# Patient Record
Sex: Male | Born: 1980 | Race: White | Hispanic: No | Marital: Single | Smoking: Current every day smoker
Health system: Southern US, Community
[De-identification: ages and names within clinical notes are randomized; demographics above are authoritative.]

---

## 2022-01-08 ENCOUNTER — Encounter (HOSPITAL_COMMUNITY): Payer: Self-pay | Admitting: Emergency Medicine

## 2022-01-08 ENCOUNTER — Emergency Department (HOSPITAL_COMMUNITY): Payer: No Typology Code available for payment source

## 2022-01-08 ENCOUNTER — Emergency Department (HOSPITAL_COMMUNITY)
Admission: EM | Admit: 2022-01-08 | Discharge: 2022-01-08 | Disposition: A | Discharge status: Home / Self Care | Payer: No Typology Code available for payment source | Attending: Emergency Medicine | Admitting: Emergency Medicine

## 2022-01-08 DIAGNOSIS — Z23 Encounter for immunization: Secondary | ICD-10-CM | POA: Insufficient documentation

## 2022-01-08 DIAGNOSIS — S61215A Laceration without foreign body of left ring finger without damage to nail, initial encounter: Secondary | ICD-10-CM | POA: Diagnosis not present

## 2022-01-08 DIAGNOSIS — W208XXA Other cause of strike by thrown, projected or falling object, initial encounter: Secondary | ICD-10-CM | POA: Insufficient documentation

## 2022-01-08 DIAGNOSIS — Y990 Civilian activity done for income or pay: Secondary | ICD-10-CM | POA: Insufficient documentation

## 2022-01-08 DIAGNOSIS — S6992XA Unspecified injury of left wrist, hand and finger(s), initial encounter: Secondary | ICD-10-CM | POA: Diagnosis present

## 2022-01-08 DIAGNOSIS — S66802A Unspecified injury of other specified muscles, fascia and tendons at wrist and hand level, left hand, initial encounter: Secondary | ICD-10-CM | POA: Insufficient documentation

## 2022-01-08 MED ORDER — TETANUS-DIPHTH-ACELL PERTUSSIS 5-2.5-18.5 LF-MCG/0.5 IM SUSY
0.5000 mL | PREFILLED_SYRINGE | Freq: Once | INTRAMUSCULAR | Status: AC
  Administered 2022-01-08: 0.5 mL via INTRAMUSCULAR
  Filled 2022-01-08: qty 0.5

## 2022-01-08 MED ORDER — LIDOCAINE HCL (PF) 1 % IJ SOLN
5.0000 mL | Freq: Once | INTRAMUSCULAR | Status: AC
  Administered 2022-01-08: 5 mL
  Filled 2022-01-08: qty 5

## 2022-01-08 NOTE — Discharge Instructions (Addendum)
Wear the splint at all times except when you are showering.  It is okay for the wound to get wet but do not scrub directly on it or soak it under water.  Change the bandage tomorrow.  It is fine for you to return to work but you will need to do light duty where you are not going to reinjure your finger.  Follow-up with a hand surgeon this week.  You can use Tylenol or ibuprofen as needed for pain. ?

## 2022-01-08 NOTE — ED Triage Notes (Signed)
Pt. Stated a rack on a car fell on my finger , (left ring finger)  ?Avulsion on the tip of left ring finger ?

## 2022-01-08 NOTE — ED Provider Notes (Signed)
?[facility] ?Provider Note ? ? ?CSN: [id number] ?Arrival date & time: 01/08/22  [id number] ? ?  ? ?History ? ?Chief Complaint  ?Patient presents with  ? Finger Injury  ? ? ?Cameron Fuller is a 41 y.o. male. ? ?Patient is a 41 year old healthy male presenting today with an injury to his left ring finger.  Patient was pushing a cart at work where he works at [employer].  The cart slipped and his finger got smashed but he is not sure if it got smashed on the rack with the bricks or another way.  He has a laceration to the tip of his finger his nail is still intact and he is having pain to the end of the finger.  He is having difficulty bending the finger.  Denies any injury elsewhere.  Unclear when his last tetanus shot was but he says its been years ? ?The history is provided by the patient.  ? ?  ? ?Home Medications ?Prior to Admission medications   ?Not on File  ?   ? ?Allergies    ?Patient has no allergy information on record.   ? ?Review of Systems   ?Review of Systems ? ?Physical Exam ?Updated Vital Signs ?BP 109/78 (BP Location: Right Arm)   Pulse 64   Temp 98.7 ?F (37.1 ?C) (Oral)   Resp 17   SpO2 96%  ?Physical Exam ?Vitals reviewed.  ?Constitutional:   ?   Appearance: Normal appearance.  ?HENT:  ?   Head: Normocephalic.  ?Pulmonary:  ?   Effort: Pulmonary effort is normal.  ?Musculoskeletal:     ?   General: Tenderness and signs of injury present.  ?     Hands: ? ?Neurological:  ?   Mental Status: He is alert. Mental status is at baseline.  ?Psychiatric:     ?   Mood and Affect: Mood normal.  ? ? ?ED Results / Procedures / Treatments   ?Labs ?(all labs ordered are listed, but only abnormal results are displayed) ?Labs Reviewed - No data to display ? ?EKG ?None ? ?Radiology ?DG Finger Ring Left ? ?Result Date: 01/08/2022 ?CLINICAL DATA:  Left ring finger injury. Finger was smashed with a cube of bricks. EXAM: LEFT RING FINGER 2+V COMPARISON:  None FINDINGS: Bony detail  somewhat obscured by overlying bandage. No definite acute fracture is identified. The joint spaces are maintained. No obvious radiopaque foreign body. IMPRESSION: No definite acute fracture or radiopaque foreign body. Electronically Signed   By: Marijo Sanes M.D.   On: 01/08/2022 10:56   ? ?Procedures ?Procedures  ? ? ?LACERATION REPAIR ?Performed by: Paiten Boies ?Authorized by: Aodhan Scheidt ?Consent: Verbal consent obtained. ?Risks and benefits: risks, benefits and alternatives were discussed ?Consent given by: patient ?Patient identity confirmed: provided demographic data ?Prepped and Draped in normal sterile fashion ?Wound explored ? ?Laceration Location: left ring finger ? ?Laceration Length: 1cm ? ?No Foreign Bodies seen or palpated ? ?Anesthesia:digital block ? ?Local anesthetic: lidocaine 1% without epinephrine ? ?Anesthetic total: 2 ml ? ?Irrigation method: syringe ?Amount of cleaning: standard ? ?Skin closure: 5.0 vicryl rapide ? ?Number of sutures: 3 ? ?Technique: simple interrupted ? ?Patient tolerance: Patient tolerated the procedure well with no immediate complications. ? ?Medications Ordered in ED ?Medications  ?lidocaine (PF) (XYLOCAINE) 1 % injection 5 mL (5 mLs Other Given by Other 01/08/22 1021)  ?Tdap (BOOSTRIX) injection 0.5 mL (0.5 mLs Intramuscular Given 01/08/22 1019)  ? ? ?ED Course/ Medical Decision  Making/ A&P ?  ?                        ?Medical Decision Making ?Amount and/or Complexity of Data Reviewed ?Radiology: ordered and independent interpretation performed. Decision-making details documented in ED Course. ? ?Risk ?Prescription drug management. ? ? ?Patient presenting today after an injury to his finger.  Patient does have some nail involvement and laceration/avulsion present.  Patient is having difficulty flexing at the DIP joint and concern for possible tendon injury.  Plain films are pending.  Area was anesthetized with a digital block.  Tetanus shot was updated. ? ?11:41  AM ?I independently interpreted patient's x-ray which is negative for fracture today.  There are no foreign bodies present.  Wound was repaired as above.  Spoke with Orion Crook with hand surgery and patient was placed in a splint with mild flexion and to follow-up with Dr. Greta Doom in the office.  Findings were discussed with patient.  He is comfortable with this plan.  He is stable for discharge. ? ? ? ? ? ? ? ?Final Clinical Impression(s) / ED Diagnoses ?Final diagnoses:  ?Laceration of left ring finger without foreign body without damage to nail, initial encounter  ?Injury of flexor tendon of left hand, initial encounter  ? ? ?Rx / DC Orders ?ED Discharge Orders   ? ? None  ? ?  ? ? ?  ?Blanchie Dessert, MD ?01/08/22 1141 ? ?

## 2023-05-16 IMAGING — CR DG FINGER RING 2+V*L*
3 series · 3 of 3 positions shown · non-contrast
Comparison: None

CLINICAL DATA: Left ring finger injury. Finger was smashed with a
cube of bricks.

EXAM:
LEFT RING FINGER 2+V

[finger ap]
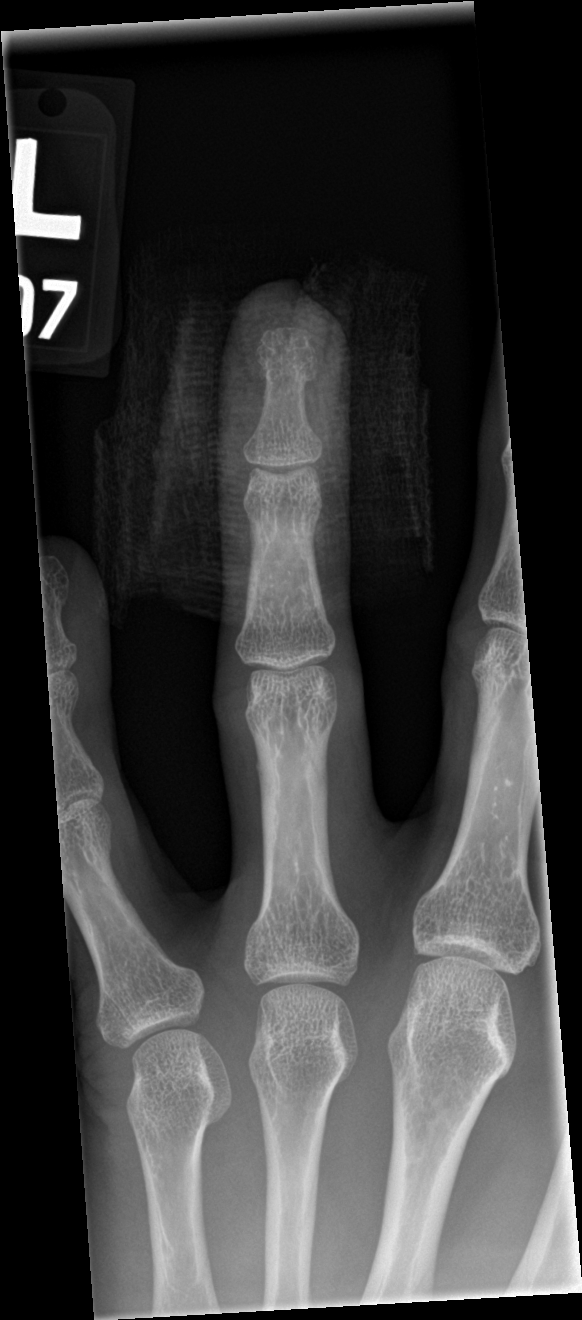

[finger obl]
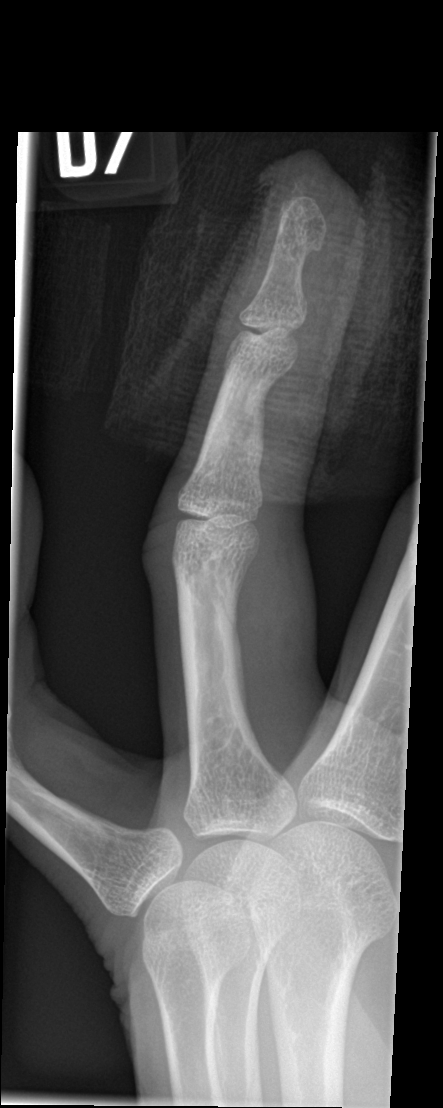

[finger lat]
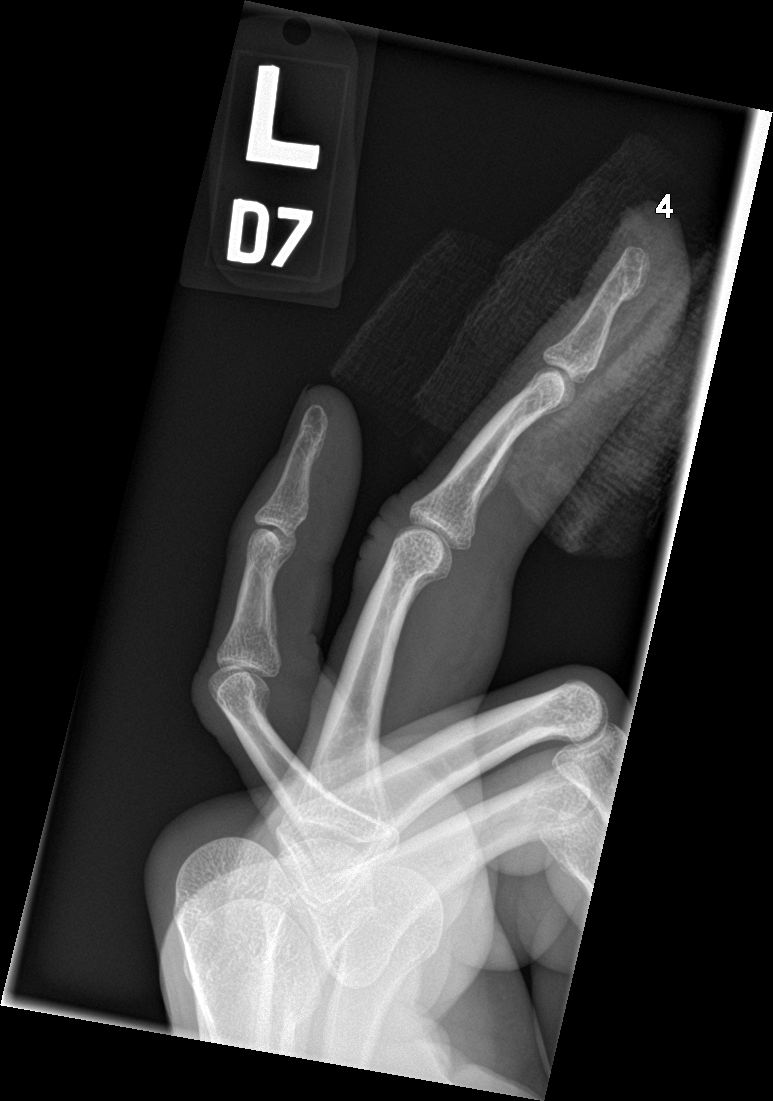

[3 of 3 positions shown; findings below may reference images not displayed]

FINDINGS: Bony detail somewhat obscured by overlying bandage. No definite
acute fracture is identified. The joint spaces are maintained. No
obvious radiopaque foreign body.
IMPRESSION: No definite acute fracture or radiopaque foreign body.
